# Patient Record
Sex: Male | Born: 1990 | Race: White | Hispanic: No | Marital: Single
Health system: Southern US, Community
[De-identification: ages and names within clinical notes are randomized; demographics above are authoritative.]

---
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
## 2019-10-11 ENCOUNTER — Encounter: Payer: Self-pay | Admitting: Emergency Medicine

## 2019-10-11 ENCOUNTER — Ambulatory Visit
Admission: EM | Admit: 2019-10-11 | Discharge: 2019-10-11 | Disposition: A | Discharge status: Home / Self Care | Payer: 59 | Attending: Family Medicine | Admitting: Family Medicine

## 2019-10-11 ENCOUNTER — Other Ambulatory Visit: Payer: Self-pay

## 2019-10-11 DIAGNOSIS — Z20822 Contact with and (suspected) exposure to covid-19: Secondary | ICD-10-CM | POA: Diagnosis not present

## 2019-10-11 LAB — SARS CORONAVIRUS 2 (TAT 6-24 HRS): SARS Coronavirus 2: NEGATIVE

## 2019-11-20 ENCOUNTER — Ambulatory Visit
Admission: EM | Admit: 2019-11-20 | Discharge: 2019-11-20 | Disposition: A | Discharge status: Home / Self Care | Payer: 59 | Attending: Emergency Medicine | Admitting: Emergency Medicine

## 2019-11-20 ENCOUNTER — Other Ambulatory Visit: Payer: Self-pay

## 2019-11-20 DIAGNOSIS — Z20822 Contact with and (suspected) exposure to covid-19: Secondary | ICD-10-CM | POA: Diagnosis not present

## 2019-11-20 NOTE — ED Triage Notes (Signed)
Patient in today for a nurse visit for covid exposure 3 days ago. Patient denies any symptoms at this time.

## 2019-11-21 LAB — SARS CORONAVIRUS 2 (TAT 6-24 HRS): SARS Coronavirus 2: NEGATIVE

## 2019-11-24 ENCOUNTER — Other Ambulatory Visit: Payer: Self-pay

## 2019-11-24 ENCOUNTER — Ambulatory Visit
Admission: EM | Admit: 2019-11-24 | Discharge: 2019-11-24 | Disposition: A | Discharge status: Home / Self Care | Payer: 59 | Attending: Family Medicine | Admitting: Family Medicine

## 2019-11-24 DIAGNOSIS — U071 COVID-19: Secondary | ICD-10-CM | POA: Diagnosis not present

## 2019-11-24 DIAGNOSIS — Z20822 Contact with and (suspected) exposure to covid-19: Secondary | ICD-10-CM | POA: Diagnosis present

## 2019-11-24 DIAGNOSIS — Z0189 Encounter for other specified special examinations: Secondary | ICD-10-CM

## 2019-11-24 LAB — SARS CORONAVIRUS 2 (TAT 6-24 HRS): SARS Coronavirus 2: POSITIVE — AB

## 2019-11-24 NOTE — ED Triage Notes (Signed)
Pt present to [facility] for covid testing. He denies any symptoms at this time.

## 2019-11-24 NOTE — Discharge Instructions (Signed)

## 2019-11-25 ENCOUNTER — Telehealth (HOSPITAL_COMMUNITY): Payer: Self-pay

## 2019-11-25 NOTE — Telephone Encounter (Signed)
Called to Discuss with patient about Covid symptoms and the use of the monoclonal antibody infusion for those with mild to moderate Covid symptoms and at a high risk of hospitalization.     Pt declined any interest in monoclonal antibodies for COIVD-19. Gave pt phone number for COVID hotline if he changes mind.
